# Patient Record
Sex: Male | Born: 1990 | Race: Black or African American | Hispanic: No | Marital: Married | Smoking: Never smoker
Health system: Southern US, Community
[De-identification: ages and names within clinical notes are randomized; demographics above are authoritative.]

## PROBLEM LIST (undated history)

## (undated) DIAGNOSIS — I080 Rheumatic disorders of both mitral and aortic valves: Secondary | ICD-10-CM

## (undated) DIAGNOSIS — I091 Rheumatic diseases of endocardium, valve unspecified: Secondary | ICD-10-CM

---

## 2013-07-20 ENCOUNTER — Emergency Department (HOSPITAL_COMMUNITY)
Admission: EM | Admit: 2013-07-20 | Discharge: 2013-07-20 | Disposition: A | Discharge status: Home / Self Care | Payer: Federal, State, Local not specified - PPO | Attending: Emergency Medicine | Admitting: Emergency Medicine

## 2013-07-20 DIAGNOSIS — S3093XA Unspecified superficial injury of penis, initial encounter: Secondary | ICD-10-CM

## 2013-07-20 DIAGNOSIS — X58XXXA Exposure to other specified factors, initial encounter: Secondary | ICD-10-CM | POA: Insufficient documentation

## 2013-07-20 DIAGNOSIS — Y929 Unspecified place or not applicable: Secondary | ICD-10-CM | POA: Insufficient documentation

## 2013-07-20 DIAGNOSIS — IMO0002 Reserved for concepts with insufficient information to code with codable children: Secondary | ICD-10-CM | POA: Insufficient documentation

## 2013-07-20 DIAGNOSIS — Y9389 Activity, other specified: Secondary | ICD-10-CM | POA: Insufficient documentation

## 2013-07-20 NOTE — ED Provider Notes (Signed)
CSN: [id number]     Arrival date & time 07/20/13  2220 History   First MD Initiated Contact with Patient 07/20/13 2313     Chief Complaint  Patient presents with  . Penis Injury   (Consider location/radiation/quality/duration/timing/severity/associated sxs/prior Treatment) HPI Comments: After intercourse.  This evening.  Wife noticed, that he had a small amount of blood to the shaft of his penis.  Male is uncircumcised.  This is never happened before  Patient is a 22 y.o. male presenting with penile injury. The history is provided by the patient and the spouse.  Penis Injury This is a new problem. The current episode started today. The problem occurs rarely. The problem has been resolved. Pertinent negatives include no fever.    No past medical history on file. No past surgical history on file. No family history on file. History  Substance Use Topics  . Smoking status: Not on file  . Smokeless tobacco: Not on file  . Alcohol Use: Not on file    Review of Systems  Constitutional: Negative for fever.  Genitourinary: Positive for penile pain. Negative for dysuria, flank pain, discharge, penile swelling, scrotal swelling and testicular pain.  All other systems reviewed and are negative.    Allergies  Review of patient's allergies indicates no known allergies.  Home Medications  No current outpatient prescriptions on file. BP 133/50  Pulse 66  Temp(Src) 98.5 F (36.9 C) (Oral)  Resp 16  SpO2 100% Physical Exam  Nursing note and vitals reviewed. Constitutional: He appears well-nourished.  HENT:  Head: Normocephalic.  Eyes: Pupils are equal, round, and reactive to light.  Cardiovascular: Normal rate.   Pulmonary/Chest: Effort normal.  Genitourinary: Penis normal. No penile erythema or penile tenderness. No discharge found.  Patient is uncircumcised male, had a small, superficial abrasion to the frenulum of his penis is small amount of bleeding.  Initially after  intercourse, which has now stopped  Musculoskeletal: Normal range of motion.  Neurological: He is alert.  Skin: Skin is warm.    ED Course  Procedures (including critical care time) Labs Review Labs Reviewed - No data to display Imaging Review No results found.  MDM   1. Superficial injury of penis, initial encounter    Recommended.  The patient.  Use a condom for the next several sexual encounters.  If this becomes a persistent problem.  I recommended he followup with urology for potential circumcision    Arman Filter, NP 07/20/13 2335

## 2013-07-20 NOTE — ED Notes (Signed)
Pt states he was having sex and the "cord" at the end of his penis ripped and was bleeding. Pt states he has no bleeding now. Pt denies pain to area. Pt denies discharge from penis. Pt ambulatory to exam room with steady gait.

## 2013-07-21 NOTE — ED Provider Notes (Signed)
Medical screening examination/treatment/procedure(s) were performed by non-physician practitioner and as supervising physician I was immediately available for consultation/collaboration.  Sunnie Nielsen, MD 07/21/13 [phone]

## 2013-12-30 ENCOUNTER — Emergency Department (HOSPITAL_COMMUNITY)
Admission: EM | Admit: 2013-12-30 | Discharge: 2013-12-30 | Disposition: A | Discharge status: Home / Self Care | Payer: Federal, State, Local not specified - PPO | Source: Home / Self Care | Attending: Family Medicine | Admitting: Family Medicine

## 2013-12-30 ENCOUNTER — Encounter (HOSPITAL_COMMUNITY): Payer: Self-pay | Admitting: Emergency Medicine

## 2013-12-30 DIAGNOSIS — J069 Acute upper respiratory infection, unspecified: Secondary | ICD-10-CM

## 2013-12-30 HISTORY — DX: Rheumatic disorders of both mitral and aortic valves: I08.0

## 2013-12-30 HISTORY — DX: Rheumatic diseases of endocardium, valve unspecified: I09.1

## 2013-12-30 LAB — POCT RAPID STREP A: Streptococcus, Group A Screen (Direct): NEGATIVE

## 2013-12-30 MED ORDER — IPRATROPIUM BROMIDE 0.06 % NA SOLN: 2.0000 | Freq: Four times a day (QID) | NASAL | Status: AC

## 2013-12-30 MED ORDER — PREDNISONE 10 MG PO TABS
30.0000 mg | ORAL_TABLET | Freq: Every day | ORAL | Status: AC
Start: 2013-12-30 — End: ?

## 2013-12-30 NOTE — Discharge Instructions (Signed)
Thank you for coming in today. Take prednisone daily for 5 days. Use Atrovent nasal spray as needed. Use Tylenol as needed as well. Call or go to the emergency room if you get worse, have trouble breathing, have chest pains, or palpitations.   Upper Respiratory Infection, Adult An upper respiratory infection (URI) is also sometimes known as the common cold. The upper respiratory tract includes the nose, sinuses, throat, trachea, and bronchi. Bronchi are the airways leading to the lungs. Most people improve within 1 week, but symptoms can last up to 2 weeks. A residual cough may last even longer.  CAUSES Many different viruses can infect the tissues lining the upper respiratory tract. The tissues become irritated and inflamed and often become very moist. Mucus production is also common. A cold is contagious. You can easily spread the virus to others by oral contact. This includes kissing, sharing a glass, coughing, or sneezing. Touching your mouth or nose and then touching a surface, which is then touched by another person, can also spread the virus. SYMPTOMS  Symptoms typically develop 1 to 3 days after you come in contact with a cold virus. Symptoms vary from person to person. They may include:  Runny nose.  Sneezing.  Nasal congestion.  Sinus irritation.  Sore throat.  Loss of voice (laryngitis).  Cough.  Fatigue.  Muscle aches.  Loss of appetite.  Headache.  Low-grade fever. DIAGNOSIS  You might diagnose your own cold based on familiar symptoms, since most people get a cold 2 to 3 times a year. Your caregiver can confirm this based on your exam. Most importantly, your caregiver can check that your symptoms are not due to another disease such as strep throat, sinusitis, pneumonia, asthma, or epiglottitis. Blood tests, throat tests, and X-rays are not necessary to diagnose a common cold, but they may sometimes be helpful in excluding other more serious diseases. Your caregiver  will decide if any further tests are required. RISKS AND COMPLICATIONS  You may be at risk for a more severe case of the common cold if you smoke cigarettes, have chronic heart disease (such as heart failure) or lung disease (such as asthma), or if you have a weakened immune system. The very young and very old are also at risk for more serious infections. Bacterial sinusitis, middle ear infections, and bacterial pneumonia can complicate the common cold. The common cold can worsen asthma and chronic obstructive pulmonary disease (COPD). Sometimes, these complications can require emergency medical care and may be life-threatening. PREVENTION  The best way to protect against getting a cold is to practice good hygiene. Avoid oral or hand contact with people with cold symptoms. Wash your hands often if contact occurs. There is no clear evidence that vitamin C, vitamin E, echinacea, or exercise reduces the chance of developing a cold. However, it is always recommended to get plenty of rest and practice good nutrition. TREATMENT  Treatment is directed at relieving symptoms. There is no cure. Antibiotics are not effective, because the infection is caused by a virus, not by bacteria. Treatment may include:  Increased fluid intake. Sports drinks offer valuable electrolytes, sugars, and fluids.  Breathing heated mist or steam (vaporizer or shower).  Eating chicken soup or other clear broths, and maintaining good nutrition.  Getting plenty of rest.  Using gargles or lozenges for comfort.  Controlling fevers with ibuprofen or acetaminophen as directed by your caregiver.  Increasing usage of your inhaler if you have asthma. Zinc gel and zinc lozenges, taken  in the first 24 hours of the common cold, can shorten the duration and lessen the severity of symptoms. Pain medicines may help with fever, muscle aches, and throat pain. A variety of non-prescription medicines are available to treat congestion and runny  nose. Your caregiver can make recommendations and may suggest nasal or lung inhalers for other symptoms.  HOME CARE INSTRUCTIONS   Only take over-the-counter or prescription medicines for pain, discomfort, or fever as directed by your caregiver.  Use a warm mist humidifier or inhale steam from a shower to increase air moisture. This may keep secretions moist and make it easier to breathe.  Drink enough water and fluids to keep your urine clear or pale yellow.  Rest as needed.  Return to work when your temperature has returned to normal or as your caregiver advises. You may need to stay home longer to avoid infecting others. You can also use a face mask and careful hand washing to prevent spread of the virus. SEEK MEDICAL CARE IF:   After the first few days, you feel you are getting worse rather than better.  You need your caregiver's advice about medicines to control symptoms.  You develop chills, worsening shortness of breath, or brown or red sputum. These may be signs of pneumonia.  You develop yellow or brown nasal discharge or pain in the face, especially when you bend forward. These may be signs of sinusitis.  You develop a fever, swollen neck glands, pain with swallowing, or white areas in the back of your throat. These may be signs of strep throat. SEEK IMMEDIATE MEDICAL CARE IF:   You have a fever.  You develop severe or persistent headache, ear pain, sinus pain, or chest pain.  You develop wheezing, a prolonged cough, cough up blood, or have a change in your usual mucus (if you have chronic lung disease).  You develop sore muscles or a stiff neck. Document Released: 04/12/2001 Document Revised: 01/09/2012 Document Reviewed: 02/18/2011 [facility] Patient Information 2014 [place], [place].

## 2013-12-30 NOTE — ED Notes (Signed)
Patient c/o he has been having nasal congestion w white/yellow/blood tinged secretions since 2-26; reportedly has been having minimal relief w OTC medications

## 2013-12-30 NOTE — ED Provider Notes (Signed)
Gregory BentSamuel Bright is a 23 y.o. male who presents to Urgent Care today for cough congestion fever headache sore throat and runny nose. Cough is somewhat productive. Since his present for 5 days and are moderate. Patient has tried over-the-counter medications which have not been very effective. No nausea vomiting or diarrhea. No significant abdominal pain. Patient feels well otherwise.   Past Medical History  Diagnosis Date  . Rheumatic valve narrowing and leaking    History  Substance Use Topics  . Smoking status: Never Smoker   . Smokeless tobacco: Not on file  . Alcohol Use: No   ROS as above Medications: No current facility-administered medications for this encounter.   Current Outpatient Prescriptions  Medication Sig Dispense Refill  . ipratropium (ATROVENT) 0.06 % nasal spray Place 2 sprays into both nostrils 4 (four) times daily.  15 mL  1  . predniSONE (DELTASONE) 10 MG tablet Take 3 tablets (30 mg total) by mouth daily.  15 tablet  0    Exam:  BP 147/80  Pulse 88  Temp(Src) 98.9 F (37.2 C) (Oral)  Resp 16  SpO2 98% Gen: Well NAD HEENT: EOMI,  MMM posterior pharynx with cobblestoning. Tympanic membranes are normal appearing bilaterally. Clear nasal discharge. Lungs: Normal work of breathing. CTABL Heart: RRR soft diastolic murmur otherwise normal Abd: NABS, Soft. NT, ND Exts: Brisk capillary refill, warm and well perfused.   Results for orders placed during the hospital encounter of 12/30/13 (from the past 24 hour(s))  POCT RAPID STREP A ([facility] ONLY)     Status: None   Collection Time    12/30/13 12:54 PM      Result Value Ref Range   Streptococcus, Group A Screen (Direct) NEGATIVE  NEGATIVE   No results found.  Assessment and Plan: 23 y.o. male with her URI. Plan for management with prednisone, Atrovent nasal spray, and Tylenol. Followup with primary care provider.  Discussed warning signs or symptoms. Please see discharge instructions. Patient expresses  understanding.    Gregory BongEvan S Corey, MD 12/30/13 1316

## 2014-01-01 LAB — CULTURE, GROUP A STREP

## 2014-02-27 ENCOUNTER — Emergency Department (HOSPITAL_COMMUNITY)
Admission: EM | Admit: 2014-02-27 | Discharge: 2014-02-27 | Disposition: A | Discharge status: Home / Self Care | Payer: Federal, State, Local not specified - PPO | Source: Home / Self Care | Attending: Emergency Medicine | Admitting: Emergency Medicine

## 2014-02-27 ENCOUNTER — Encounter (HOSPITAL_COMMUNITY): Payer: Self-pay | Admitting: Emergency Medicine

## 2014-02-27 DIAGNOSIS — R197 Diarrhea, unspecified: Secondary | ICD-10-CM

## 2014-02-27 MED ORDER — LOPERAMIDE HCL 2 MG PO CAPS: 2.0000 mg | ORAL_CAPSULE | Freq: Four times a day (QID) | ORAL | Status: AC | PRN

## 2014-02-27 NOTE — ED Provider Notes (Signed)
CSN: [id number]     Arrival date & time 02/27/14  1535 History   First MD Initiated Contact with Patient 02/27/14 1554     Chief Complaint  Patient presents with  . Abdominal Pain   (Consider location/radiation/quality/duration/timing/severity/associated sxs/prior Treatment) HPI Comments: Wife ill with same PCP: none Works at [employer]  Patient is a 23 y.o. male presenting with diarrhea. The history is provided by the patient.  Diarrhea Diarrhea characteristics: "loose" non-bloody. Severity:  Mild Number of episodes:  2 Duration: began this morning. Timing:  Intermittent Progression:  Unchanged Associated symptoms: no chills, no fever, no headaches, no myalgias and no vomiting   Risk factors: no recent antibiotic use, no sick contacts, no suspicious food intake and no travel to endemic areas     Past Medical History  Diagnosis Date  . Rheumatic valve narrowing and leaking    History reviewed. No pertinent past surgical history. No family history on file. History  Substance Use Topics  . Smoking status: Never Smoker   . Smokeless tobacco: Not on file  . Alcohol Use: No    Review of Systems  Constitutional: Positive for appetite change. Negative for fever, chills, activity change and fatigue.  HENT: Negative.   Eyes: Negative.   Respiratory: Negative.   Cardiovascular: Negative.   Gastrointestinal: Positive for diarrhea. Negative for nausea, vomiting, constipation, blood in stool, abdominal distention and anal bleeding.       +abdominal cramping and tenesmus  Endocrine: Negative for polydipsia, polyphagia and polyuria.  Genitourinary: Negative.   Musculoskeletal: Negative.  Negative for myalgias.  Skin: Negative.   Neurological: Negative for dizziness, weakness and headaches.    Allergies  Review of patient's allergies indicates no known allergies.  Home Medications   Prior to Admission medications   Medication Sig Start Date End Date Taking? Authorizing  Provider  ipratropium (ATROVENT) 0.06 % nasal spray Place 2 sprays into both nostrils 4 (four) times daily. 12/30/13   [name], MD  predniSONE (DELTASONE) 10 MG tablet Take 3 tablets (30 mg total) by mouth daily. 12/30/13   [name], MD   BP 114/70  Pulse 64  Temp(Src) 97.7 F (36.5 C) (Oral)  Resp 20  SpO2 98% Physical Exam  Nursing note and vitals reviewed. Constitutional: He is oriented to person, place, and time. He appears well-developed and well-nourished.  HENT:  Head: Normocephalic and atraumatic.  Mouth/Throat: Oropharynx is clear and moist.  Eyes: Conjunctivae are normal. No scleral icterus.  Cardiovascular: Normal rate, regular rhythm and normal heart sounds.   Pulmonary/Chest: Effort normal and breath sounds normal.  Abdominal: Soft. Bowel sounds are normal. He exhibits no distension and no mass. There is no tenderness. There is no guarding.  Musculoskeletal: Normal range of motion.  Neurological: He is alert and oriented to person, place, and time.  Skin: Skin is warm and dry. No rash noted.  Psychiatric: He has a normal mood and affect. His behavior is normal.    ED Course  Procedures (including critical care time) Labs Review Labs Reviewed - No data to display  Imaging Review No results found.   MDM   1. Diarrhea    Discussed symptomatic care at home with patient.     Jess BartersJennifer Lee GreenwoodPresson, Georgia[place] 02/27/14 [phone]

## 2014-02-27 NOTE — Discharge Instructions (Signed)
Diarrhea Diarrhea is frequent loose and watery bowel movements. It can cause you to feel weak and dehydrated. Dehydration can cause you to become tired and thirsty, have a dry mouth, and have decreased urination that often is dark yellow. Diarrhea is a sign of another problem, most often an infection that will not last long. In most cases, diarrhea typically lasts 2 3 days. However, it can last longer if it is a sign of something more serious. It is important to treat your diarrhea as directed by your caregive to lessen or prevent future episodes of diarrhea. CAUSES  Some common causes include:  Gastrointestinal infections caused by viruses, bacteria, or parasites.  Food poisoning or food allergies.  Certain medicines, such as antibiotics, chemotherapy, and laxatives.  Artificial sweeteners and fructose.  Digestive disorders. HOME CARE INSTRUCTIONS  Ensure adequate fluid intake (hydration): have 1 cup (8 oz) of fluid for each diarrhea episode. Avoid fluids that contain simple sugars or sports drinks, fruit juices, whole milk products, and sodas. Your urine should be clear or pale yellow if you are drinking enough fluids. Hydrate with an oral rehydration solution that you can purchase at pharmacies, retail stores, and online. You can prepare an oral rehydration solution at home by mixing the following ingredients together:    tsp table salt.   tsp baking soda.   tsp salt substitute containing potassium chloride.  1  tablespoons sugar.  1 L (34 oz) of water.  Certain foods and beverages may increase the speed at which food moves through the gastrointestinal (GI) tract. These foods and beverages should be avoided and include:  Caffeinated and alcoholic beverages.  High-fiber foods, such as raw fruits and vegetables, nuts, seeds, and whole grain breads and cereals.  Foods and beverages sweetened with sugar alcohols, such as xylitol, sorbitol, and mannitol.  Some foods may be well  tolerated and may help thicken stool including:  Starchy foods, such as rice, toast, pasta, low-sugar cereal, oatmeal, grits, baked potatoes, crackers, and bagels.  Bananas.  Applesauce.  Add probiotic-rich foods to help increase healthy bacteria in the GI tract, such as yogurt and fermented milk products.  Wash your hands well after each diarrhea episode.  Only take over-the-counter or prescription medicines as directed by your caregiver.  Take a warm bath to relieve any burning or pain from frequent diarrhea episodes. SEEK IMMEDIATE MEDICAL CARE IF:   You are unable to keep fluids down.  You have persistent vomiting.  You have blood in your stool, or your stools are black and tarry.  You do not urinate in 6 8 hours, or there is only a small amount of very dark urine.  You have abdominal pain that increases or localizes.  You have weakness, dizziness, confusion, or lightheadedness.  You have a severe headache.  Your diarrhea gets worse or does not get better.  You have a fever or persistent symptoms for more than 2 3 days.  You have a fever and your symptoms suddenly get worse. MAKE SURE YOU:   Understand these instructions.  Will watch your condition.  Will get help right away if you are not doing well or get worse. Document Released: 10/07/2002 Document Revised: 10/03/2012 Document Reviewed: 06/24/2012 [employer] Patient Information 2014 [employer]. Diet for Diarrhea, Adult Frequent, runny stools (diarrhea) may be caused or worsened by food or drink. Diarrhea may be relieved by changing your diet. Since diarrhea can last up to 7 days, it is easy for you to lose too much fluid   from the body and become dehydrated. Fluids that are lost need to be replaced. Along with a modified diet, make sure you drink enough fluids to keep your urine clear or pale yellow. DIET INSTRUCTIONS  Ensure adequate fluid intake (hydration): have 1 cup (8 oz) of fluid for each diarrhea  episode. Avoid fluids that contain simple sugars or sports drinks, fruit juices, whole milk products, and sodas. Your urine should be clear or pale yellow if you are drinking enough fluids. Hydrate with an oral rehydration solution that you can purchase at pharmacies, retail stores, and online. You can prepare an oral rehydration solution at home by mixing the following ingredients together:    tsp table salt.   tsp baking soda.   tsp salt substitute containing potassium chloride.  1  tablespoons sugar.  1 L (34 oz) of water.  Certain foods and beverages may increase the speed at which food moves through the gastrointestinal (GI) tract. These foods and beverages should be avoided and include:  Caffeinated and alcoholic beverages.  High-fiber foods, such as raw fruits and vegetables, nuts, seeds, and whole grain breads and cereals.  Foods and beverages sweetened with sugar alcohols, such as xylitol, sorbitol, and mannitol.  Some foods may be well tolerated and may help thicken stool including:  Starchy foods, such as rice, toast, pasta, low-sugar cereal, oatmeal, grits, baked potatoes, crackers, and bagels.   Bananas.   Applesauce.  Add probiotic-rich foods to help increase healthy bacteria in the GI tract, such as yogurt and fermented milk products. RECOMMENDED FOODS AND BEVERAGES Starches Choose foods with less than 2 g of fiber per serving.  Recommended:  White, French, and pita breads, plain rolls, buns, bagels. Plain muffins, matzo. Soda, saltine, or graham crackers. Pretzels, melba toast, zwieback. Cooked cereals made with water: cornmeal, farina, cream cereals. Dry cereals: refined corn, wheat, rice. Potatoes prepared any way without skins, refined macaroni, spaghetti, noodles, refined rice.  Avoid:  Bread, rolls, or crackers made with whole wheat, multi-grains, rye, bran seeds, nuts, or coconut. Corn tortillas or taco shells. Cereals containing whole grains, multi-grains,  bran, coconut, nuts, raisins. Cooked or dry oatmeal. Coarse wheat cereals, granola. Cereals advertised as "high-fiber." Potato skins. Whole grain pasta, wild or brown rice. Popcorn. Sweet potatoes, yams. Sweet rolls, doughnuts, waffles, pancakes, sweet breads. Vegetables  Recommended: Strained tomato and vegetable juices. Most well-cooked and canned vegetables without seeds. Fresh: Tender lettuce, cucumber without the skin, cabbage, spinach, bean sprouts.  Avoid: Fresh, cooked, or canned: Artichokes, baked beans, beet greens, broccoli, Brussels sprouts, corn, kale, legumes, peas, sweet potatoes. Cooked: Green or red cabbage, spinach. Avoid large servings of any vegetables because vegetables shrink when cooked, and they contain more fiber per serving than fresh vegetables. Fruit  Recommended: Cooked or canned: Apricots, applesauce, cantaloupe, cherries, fruit cocktail, grapefruit, grapes, kiwi, mandarin oranges, peaches, pears, plums, watermelon. Fresh: Apples without skin, ripe banana, grapes, cantaloupe, cherries, grapefruit, peaches, oranges, plums. Keep servings limited to  cup or 1 piece.  Avoid: Fresh: Apples with skin, apricots, mangoes, pears, raspberries, strawberries. Prune juice, stewed or dried prunes. Dried fruits, raisins, dates. Large servings of all fresh fruits. Protein  Recommended: Ground or well-cooked tender beef, ham, veal, lamb, pork, or poultry. Eggs. Fish, oysters, shrimp, lobster, other seafoods. Liver, organ meats.  Avoid: Tough, fibrous meats with gristle. Peanut butter, smooth or chunky. Cheese, nuts, seeds, legumes, dried peas, beans, lentils. Dairy  Recommended: Yogurt, lactose-free milk, kefir, drinkable yogurt, buttermilk, soy milk, or plain hard cheese.    Avoid: Milk, chocolate milk, beverages made with milk, such as milkshakes. Soups  Recommended: Bouillon, broth, or soups made from allowed foods. Any strained soup.  Avoid: Soups made from vegetables that  are not allowed, cream or milk-based soups. Desserts and Sweets  Recommended: Sugar-free gelatin, sugar-free frozen ice pops made without sugar alcohol.  Avoid: Plain cakes and cookies, pie made with fruit, pudding, custard, cream pie. Gelatin, fruit, ice, sherbet, frozen ice pops. Ice cream, ice milk without nuts. Plain hard candy, honey, jelly, molasses, syrup, sugar, chocolate syrup, gumdrops, marshmallows. Fats and Oils  Recommended: Limit fats to less than 8 tsp per day.  Avoid: Seeds, nuts, olives, avocados. Margarine, butter, cream, mayonnaise, salad oils, plain salad dressings. Plain gravy, crisp bacon without rind. Beverages  Recommended: Water, decaffeinated teas, oral rehydration solutions, sugar-free beverages not sweetened with sugar alcohols.  Avoid: Fruit juices, caffeinated beverages (coffee, tea, soda), alcohol, sports drinks, or lemon-lime soda. Condiments  Recommended: Ketchup, mustard, horseradish, vinegar, cocoa powder. Spices in moderation: allspice, basil, bay leaves, celery powder or leaves, cinnamon, cumin powder, curry powder, ginger, mace, marjoram, onion or garlic powder, oregano, paprika, parsley flakes, ground pepper, rosemary, sage, savory, tarragon, thyme, turmeric.  Avoid: Coconut, honey. Document Released: 01/07/2004 Document Revised: 07/11/2012 Document Reviewed: 03/02/2012 [employer] Patient Information 2014 [employer].  

## 2014-02-27 NOTE — ED Notes (Signed)
Pt c/o abd pain onset this am around 0630 Sx include diarrhea x2 episodes today Angelique BlonderDenise f/n/d, bladder problems Wife is being treated here today for similar sx Alert w/no signs of acute distress.

## 2014-02-27 NOTE — ED Provider Notes (Signed)
Medical screening examination/treatment/procedure(s) were performed by non-physician practitioner and as supervising physician I was immediately available for consultation/collaboration.  Izel Hochberg, M.D.  Finola Rosal C Jiovanna Frei, MD 02/27/14 2358 

## 2014-05-09 ENCOUNTER — Encounter (HOSPITAL_COMMUNITY): Payer: Self-pay | Admitting: Emergency Medicine

## 2014-05-09 ENCOUNTER — Emergency Department (HOSPITAL_COMMUNITY)
Admission: EM | Admit: 2014-05-09 | Discharge: 2014-05-09 | Disposition: A | Discharge status: Home / Self Care | Payer: Federal, State, Local not specified - PPO | Source: Home / Self Care | Attending: Emergency Medicine | Admitting: Emergency Medicine

## 2014-05-09 ENCOUNTER — Emergency Department (INDEPENDENT_AMBULATORY_CARE_PROVIDER_SITE_OTHER): Payer: Federal, State, Local not specified - PPO

## 2014-05-09 DIAGNOSIS — M6790 Unspecified disorder of synovium and tendon, unspecified site: Secondary | ICD-10-CM

## 2014-05-09 DIAGNOSIS — M719 Bursopathy, unspecified: Secondary | ICD-10-CM

## 2014-05-09 DIAGNOSIS — M6798 Unspecified disorder of synovium and tendon, other site: Secondary | ICD-10-CM

## 2014-05-09 DIAGNOSIS — M67951 Unspecified disorder of synovium and tendon, right thigh: Secondary | ICD-10-CM

## 2014-05-09 DIAGNOSIS — M7522 Bicipital tendinitis, left shoulder: Secondary | ICD-10-CM

## 2014-05-09 DIAGNOSIS — M7520 Bicipital tendinitis, unspecified shoulder: Secondary | ICD-10-CM

## 2014-05-09 MED ORDER — MELOXICAM 15 MG PO TABS: 15.0000 mg | ORAL_TABLET | Freq: Every day | ORAL | Status: AC

## 2014-05-09 NOTE — ED Provider Notes (Signed)
Chief Complaint    Chief Complaint  Patient presents with  . Extremity Pain    History of Present Illness     Ala BentSamuel Kreitz is a 23 year old male who works for [employer] loading trucks. Over the past 2 weeks he's had pain in his right biceps area in his right hip. The pain seemed to get worse at work and better with rest. He denies any neck pain, radiation the pain down his arm, pain with movement of the shoulder, numbness, tingling, or weakness. The pain in the back extension lateral hip area down to the lateral knee. Is also worse while at work. There's been no swelling. No numbness, tingling, weakness, bladder or bowel dysfunction.  Review of Systems     Other than as noted above, the patient denies any of the following symptoms: Systemic:  No fevers or chills.   Musculoskeletal:  No joint pain, arthritis, back pain, or neck pain. Neurological:  No muscular weakness or paresthesias.  PMFSH    Past medical history, family history, social history, meds, and allergies were reviewed.    Physical Exam    Vital signs:  BP 121/78  Pulse 58  Temp(Src) 98 F (36.7 C) (Oral)  Resp 14  SpO2 100% Gen:  Alert and oriented times 3.  In no distress. Musculoskeletal: Exam of the shoulder reveals no pain to palpation, full range of motion, negative impingement signs. There was no pain to palpation of the biceps his triceps tendon and elbow have full range of motion with no pain. Exam of the hip reveals pain to palpation laterally. The hip had a full range of motion. Fadir maneuver was positive.  Otherwise, all joints had a full a ROM with no swelling, bruising or deformity.  No edema, pulses full. Extremities were warm and pink.  Capillary refill was brisk.  Skin:  Clear, warm and dry.  No rash. Neuro:  Alert and oriented times 3.  Muscle strength was normal.  Sensation was intact to light touch.    Radiology     Dg Hip Complete Right  05/09/2014   CLINICAL DATA:  Mid right femur pain  EXAM:  RIGHT HIP - COMPLETE 2+ VIEW  COMPARISON:  None.  FINDINGS: There is no evidence of hip fracture or dislocation. There is no evidence of arthropathy or other focal bone abnormality.  IMPRESSION: Negative.   Electronically Signed   By: Christiana PellantGretchen  Green M.D.   On: 05/09/2014 18:14   Dg Humerus Right  05/09/2014   CLINICAL DATA:  Extremity pain  EXAM: RIGHT HUMERUS - 2+ VIEW  COMPARISON:  None.  FINDINGS: No fracture or dislocation of the right humerus. The elbow joint and shoulder joint appear normal on two views  IMPRESSION: No acute osseous abnormality.   Electronically Signed   By: Genevive BiStewart  Edmunds M.D.   On: 05/09/2014 18:04   I reviewed the images independently and personally and concur with the radiologist's findings.  Assessment    The primary encounter diagnosis was Biceps tendonitis, left. A diagnosis of Tendinopathy of right gluteus medius was also pertinent to this visit.  Probably overuse injuries. Suggested complete rest from work for 3 days followed by a 25 pound lifting restriction. He was begun on exercises and should followup with orthopedics if no better in 2 weeks.  Plan   1.  Meds:  The following meds were prescribed:   Discharge Medication List as of 05/09/2014  6:25 PM    START taking these medications   Details  meloxicam (MOBIC) 15 MG tablet Take 1 tablet (15 mg total) by mouth daily., Starting 05/09/2014, Until Discontinued, Normal        2.  Patient Education/Counseling:  The patient was given appropriate handouts, self care instructions, and instructed in symptomatic relief, including rest and activity, elevation, application of ice and compression.    3.  Follow up:  The patient was told to follow up here if no better in 3 to 4 days, or sooner if becoming worse in any way, and given some red flag symptoms such as worsening pain or new neurological symptoms which would prompt immediate return.  Follow up here as needed.     Reuben Likes, MD 05/09/14 2216

## 2014-05-09 NOTE — Discharge Instructions (Signed)
Most shoulder pain is caused by soft tissue problems rather than arthritis.  Rotator cuff tendonitis or tendonosis, rotator cuff tears, impingement syndrome and cartilege (labrum tears) are a few of the common causes of shoulder pain.  Fortunately, most of these can be treated with conservative measures as outlined below.  Do not do the following:  Doing any work with the arms above shoulder level (especially lifting) until the pain has subsided.  Sleeping on the affected side.  Especially avoid sleeping with your arm under your head or your pillow.  This is a habit that is hard to break.  Some people have to pin the arm of their pajamas to the chest area to prevent this.  Do the following:  Do the shoulder exercises below twice daily followed by ice for 10 minutes.  If no better in 1 month, follow up here, with your primary care doctor, or with an orthopedist.  Use of over the counter pain meds can be of help.  Tylenol (or acetaminophen) is the safest to use.  It often helps to take this regularly.  You can take up to 2 325 mg tablets 5 times daily, but it best to start out much lower that that, perhaps 2 325 mg tablets twice daily, then increase from there. People who are on the blood thinner warfarin have to be careful about taking high doses of Tylenol.  For people who are able to tolerate them, ibuprofen and Aleve can also help with the pain.  You should discuss these agents with your physician before taking them.  People with chronic kidney disease, hypertension, peptic ulcer disease, and reflux can suffer adverse side effects. They should not be taken with warfarin. The maximum dosage of ibuprofen is 800 mg 3 times daily with meals.  The maximum dosage of Aleve is 2 and 1/2 tablets twice daily with food, but again, start out low and gradually increase the dose until adequate pain relief is achieved. Ibuprofen and Aleve should always be taken with food.       Most hip pain is caused by  osteoarthritis, bursitis, or tendonitis.  Simple measures plus regular gentle exercises can help.  Do not do the following:  Avoid squatting and doing deep knee bends.  This puts too much of load on your cartiledges and tendons.  If you do a knee bend, go only half way down, flexing your knee no more than 90 degrees.  Avoid sleeping on the side that hurts.  Do the following:  If you are overweight or obese, lose weight.  This makes for a lot less load on your hip joints.  If you use tobacco, quit.  Nicotine causes spasm of the small arteries, decreases blood flow, and impairs your body's normal ability to repair damage.  If your hip is acutely inflamed, use the principles of RICE (rest, ice, compression, and elevation).  Use of over the counter pain meds can be of help.  Tylenol (or acetaminophen) is the safest to use.  It often helps to take this regularly.  You can take up to 2 325 mg tablets 5 times daily, but it best to start out much lower that that, perhaps 2 325 mg tablets twice daily, then increase from there. People who are on the blood thinner warfarin have to be careful about taking high doses of Tylenol.  For people who are able to tolerate them, ibuprofen and naproxyn can also help with the pain.  You should discuss these agents with  your physician before taking them.  People with chronic kidney disease, hypertension, peptic ulcer disease, and reflux can suffer adverse side effects. They should not be taken with warfarin. The maximum dosage of ibuprofen is 800 mg 3 times daily with meals.  The maximum dosage of naprosyn is 2 and 1/2 tablets twice daily with food, but again, start out low and gradually increase the dose until adequate pain relief is achieved. Ibuprofen and naprosyn should always be taken with food.  People with cartiledge injury or osteoarthritis may find glucosamine to be helpful.  This is an over-the-counter supplement that helps nourish and repair cartiledge.  The  dose is 500 mg 3 times daily or 1500 mg taken in a single dose. This can take several months to work and it doesn't always work.    For people with hip pain on just one side, use of a cane held in the hand on the same side as the hip pain takes some of the stress off the hip joint and can make a big difference in hip pain.  Wearing good shoes with adequate arch support is essential. Use of an orthotic insert can be very helpful.  These can be purchased at a shoe store or inexpensive inserts can be gotten at the drug store.  Regular exercise is of utmost importance.  Swimming, water aerobics, low impact aerobics, yoga or tai chi are helpful.  Use of an elliptical exerciser put the least stress on the hips of any type of exercise machine.  Finally doing the exercises below can be very helpful. Try to do them twice a day followed by ice for 10 minutes.

## 2014-05-09 NOTE — ED Notes (Signed)
Pt  Reports  Over the last  Several  Weeks  He  Has  Had  Pain r  Upper  Arm  And  r  Leg  He  denys  specefic  Injury  But  Reports  Does    Lifting    And       Pulling at  Work          Ambulated  To  Room  With a  Steady  Fluid  Gait

## 2015-10-06 IMAGING — CR DG HUMERUS 2V *R*
2 series · 2 of 2 positions shown · non-contrast
Comparison: None.

CLINICAL DATA: Extremity pain

EXAM:
RIGHT HUMERUS - 2+ VIEW

[view not recorded (1 of 2)]
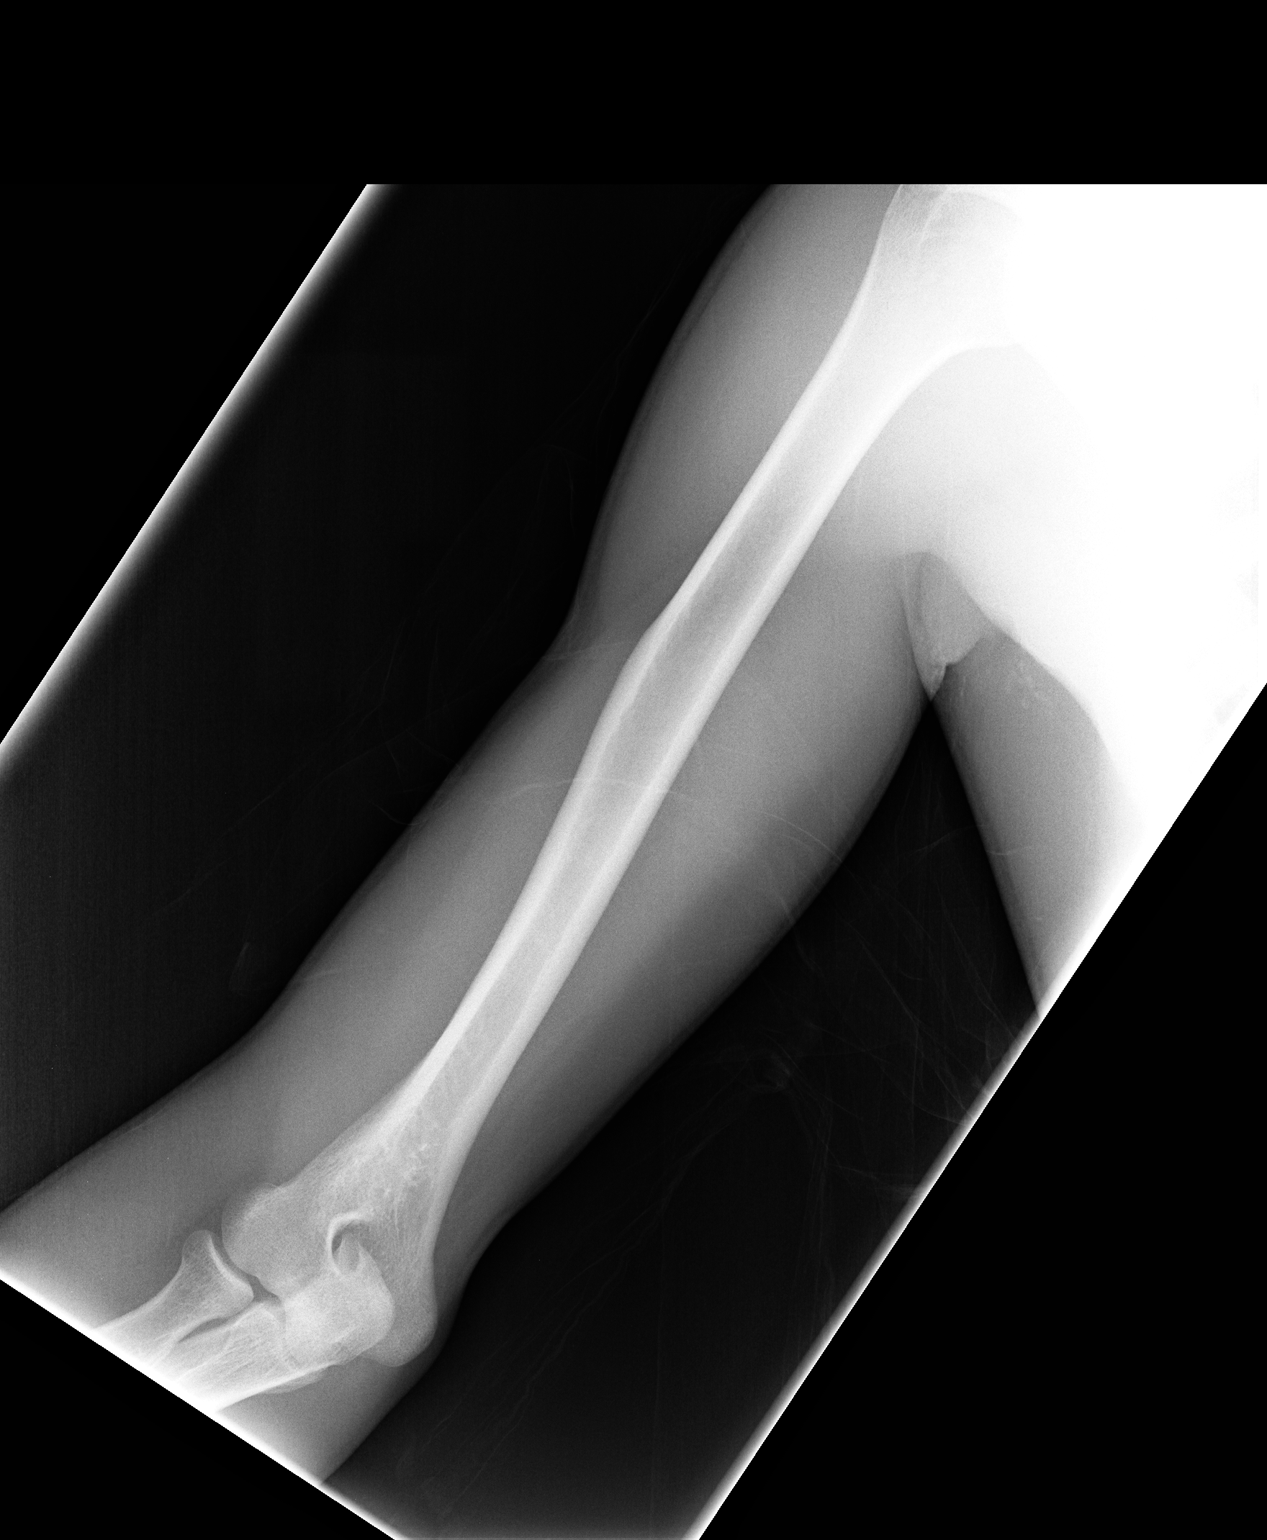

[view not recorded (2 of 2)]
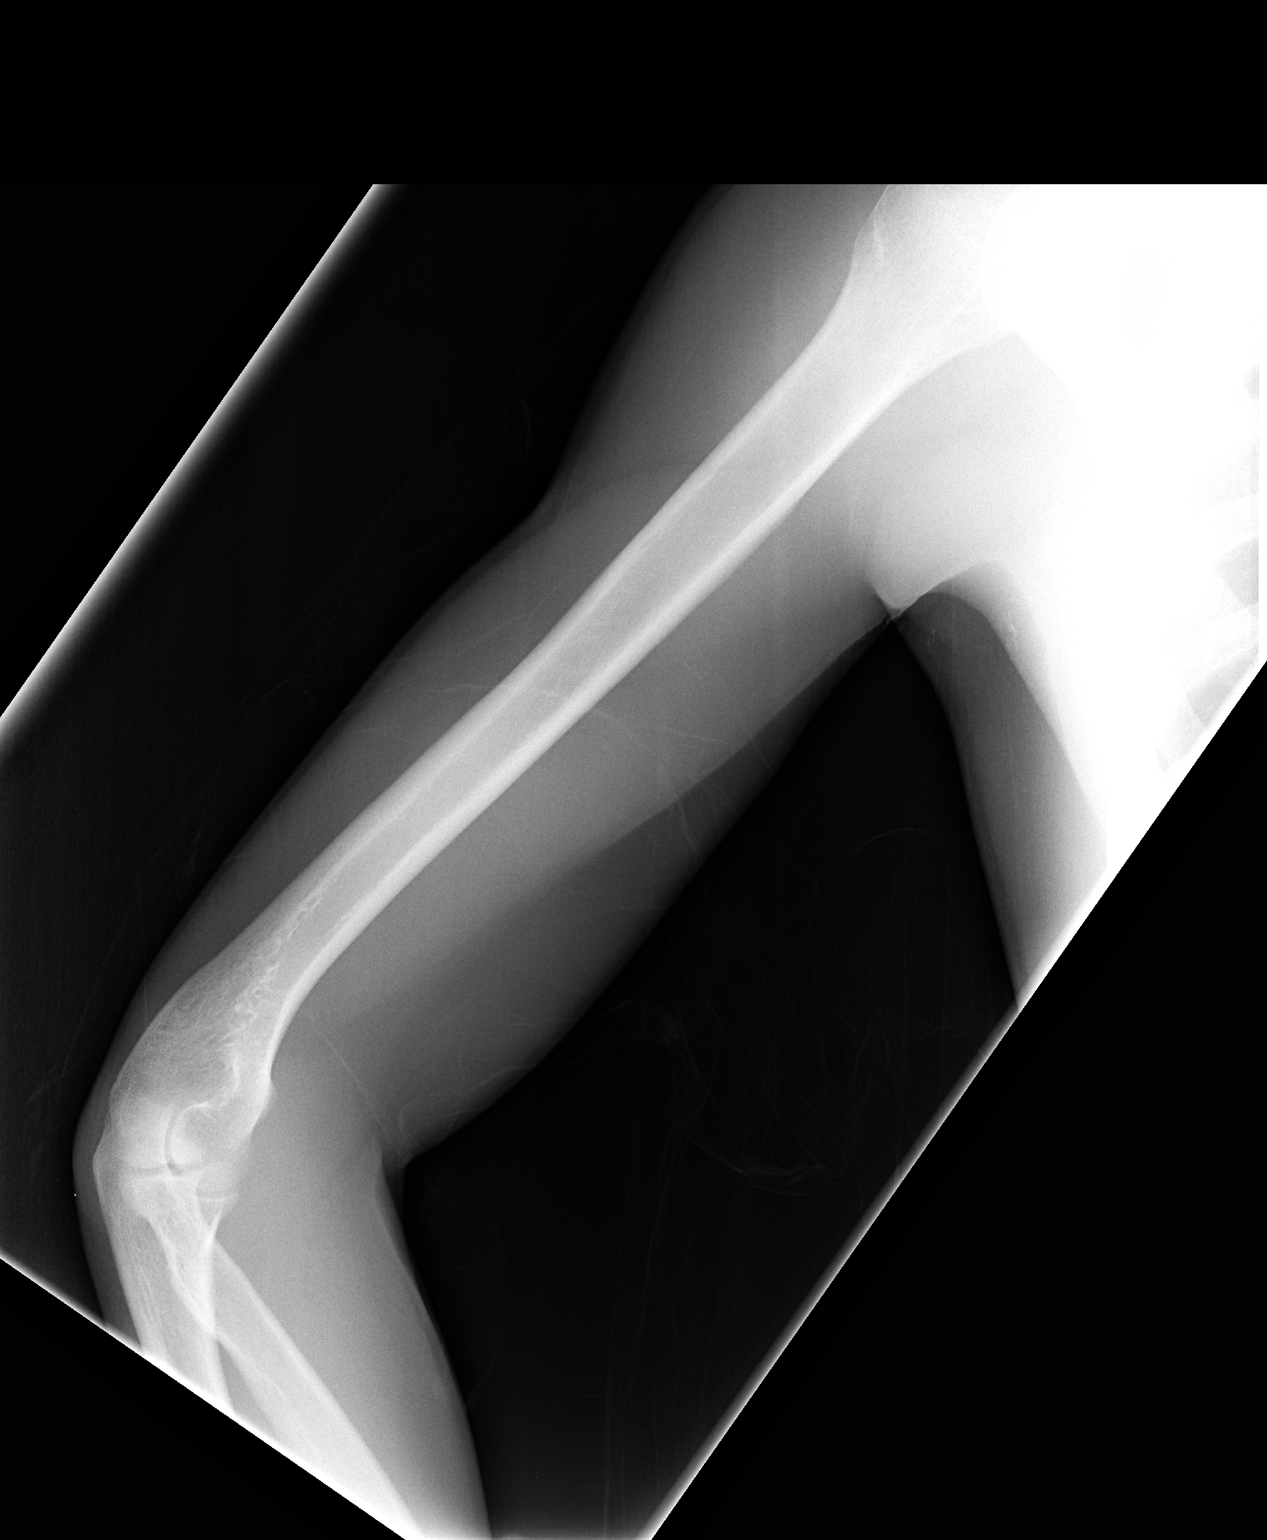

[2 of 2 positions shown; findings below may reference images not displayed]

FINDINGS: No fracture or dislocation of the right humerus. The elbow joint and
shoulder joint appear normal on two views
IMPRESSION: No acute osseous abnormality.
